# Patient Record
Sex: Female | Born: 1955 | Race: Black or African American | Hispanic: No | Smoking: Current every day smoker
Health system: Southern US, Community
[De-identification: ages and names within clinical notes are randomized; demographics above are authoritative.]

## PROBLEM LIST (undated history)

## (undated) DIAGNOSIS — I10 Essential (primary) hypertension: Secondary | ICD-10-CM

## (undated) DIAGNOSIS — Z780 Asymptomatic menopausal state: Secondary | ICD-10-CM

## (undated) HISTORY — PX: TONSILLECTOMY: SUR1361

---

## 2014-09-06 ENCOUNTER — Ambulatory Visit: Payer: Self-pay | Admitting: Internal Medicine

## 2015-02-19 ENCOUNTER — Emergency Department
Admission: EM | Admit: 2015-02-19 | Discharge: 2015-02-19 | Disposition: A | Discharge status: Home / Self Care | Payer: BLUE CROSS/BLUE SHIELD | Attending: Emergency Medicine | Admitting: Emergency Medicine

## 2015-02-19 ENCOUNTER — Emergency Department: Payer: BLUE CROSS/BLUE SHIELD

## 2015-02-19 DIAGNOSIS — M25552 Pain in left hip: Secondary | ICD-10-CM | POA: Insufficient documentation

## 2015-02-19 DIAGNOSIS — R1032 Left lower quadrant pain: Secondary | ICD-10-CM | POA: Diagnosis present

## 2015-02-19 LAB — URINALYSIS COMPLETE WITH MICROSCOPIC (ARMC ONLY)
BACTERIA UA: NONE SEEN
Bilirubin Urine: NEGATIVE
Glucose, UA: NEGATIVE mg/dL
Ketones, ur: NEGATIVE mg/dL
LEUKOCYTES UA: NEGATIVE
Nitrite: NEGATIVE
PH: 5 (ref 5.0–8.0)
Protein, ur: NEGATIVE mg/dL
Specific Gravity, Urine: 1.027 (ref 1.005–1.030)

## 2015-02-19 MED ORDER — NAPROXEN 500 MG PO TABS
500.0000 mg | ORAL_TABLET | Freq: Two times a day (BID) | ORAL | Status: DC
  Administered 2015-02-19: 500 mg via ORAL

## 2015-02-19 MED ORDER — NAPROXEN 500 MG PO TABS
ORAL_TABLET | ORAL | Status: AC
  Filled 2015-02-19: qty 1

## 2015-02-19 MED ORDER — NAPROXEN 500 MG PO TABS: 500.0000 mg | ORAL_TABLET | Freq: Two times a day (BID) | ORAL | Status: AC

## 2015-02-19 NOTE — ED Notes (Signed)
Patient reports bilateral groin pain since yesterday.  Denies any type of injury.  Patient reports she had rash in same area and unsure if they are related.

## 2015-02-19 NOTE — ED Provider Notes (Signed)
[facility] Emergency Department Provider Note ____________________________________________  Time seen: Approximately 9:47 PM  I have reviewed the triage vital signs and the nursing notes.   HISTORY  Chief Complaint Groin Pain    HPI Jacqueline Rollins is a 59 y.o. female who presents to the emergency department for left groin pain. She states that while standing in the kitchen cooking she had a sharp stabbing pain in the left groin that radiated down into her leg and across her abdomen. She denies the pain now but states that she is afraid that it's going to return.   No past medical history on file.  There are no active problems to display for this patient.   No past surgical history on file.  No current outpatient prescriptions on file.  Allergies Review of patient's allergies indicates no known allergies.  No family history on file.  Social History History  Substance Use Topics  . Smoking status: Not on file  . Smokeless tobacco: Not on file  . Alcohol Use: Not on file    Review of Systems Constitutional: No recent illness. Eyes: No visual changes. ENT: No sore throat. Cardiovascular: Denies chest pain or palpitations. Respiratory: Denies shortness of breath. Gastrointestinal: No abdominal pain.  Genitourinary: Negative for dysuria. Musculoskeletal: Pain in left groin/anterior hip area. Skin: Negative for rash. Neurological: Negative for headaches, focal weakness or numbness. 10-point ROS otherwise negative.  ____________________________________________   PHYSICAL EXAM:  VITAL SIGNS: ED Triage Vitals  Enc Vitals Group     BP 02/19/15 2022 166/81 mmHg     Pulse Rate 02/19/15 2022 70     Resp 02/19/15 2022 18     Temp 02/19/15 2022 98.1 F (36.7 C)     Temp Source 02/19/15 2022 Oral     SpO2 02/19/15 2022 98 %     Weight 02/19/15 2022 180 lb (81.647 kg)     Height 02/19/15 2022 5\' 6"  (1.676 m)     Head Cir --      Peak  Flow --      Pain Score 02/19/15 2024 0     Pain Loc --      Pain Edu? --      Excl. in [place]? --     Constitutional: Alert and oriented. Well appearing and in no acute distress. Eyes: Conjunctivae are normal. EOMI. Head: Atraumatic. Nose: No congestion/rhinnorhea. Neck: No stridor.  Respiratory: Normal respiratory effort. Abdomen: No tenderness with palpation.   Musculoskeletal: Full ROM of hip without pain on internal /external rotation. Weight bearing without assistance. Neurologic:  Normal speech and language. No gross focal neurologic deficits are appreciated. Speech is normal. No gait instability. Skin:  Skin is warm, dry and intact. Atraumatic. Psychiatric: Mood and affect are normal. Speech and behavior are normal.  ____________________________________________   LABS (all labs ordered are listed, but only abnormal results are displayed)  Labs Reviewed  URINALYSIS COMPLETEWITH MICROSCOPIC ([place]) - Abnormal; Notable for the following:    Color, Urine YELLOW (*)    APPearance CLEAR (*)    Hgb urine dipstick 2+ (*)    Squamous Epithelial / LPF 0-5 (*)    All other components within normal limits   ____________________________________________  RADIOLOGY  Hip negative for acute pathology--image viewed by me. ____________________________________________   PROCEDURES  Procedure(s) performed: None   ____________________________________________   INITIAL IMPRESSION / ASSESSMENT AND PLAN / ED COURSE  Pertinent labs & imaging results that were available during my care of the patient were  reviewed by me and considered in my medical decision making (see chart for details).  Patient was advised to follow-up with orthopedics for symptoms that return. She was advised to return to the emergency department for symptoms that change or worsen if she is unable schedule an appointment. ____________________________________________   FINAL CLINICAL IMPRESSION(S) / ED  DIAGNOSES  Final diagnoses:  Left groin pain      Victorino Dike, FNP 02/19/15 2315  Lavonia Drafts, MD 02/20/15 1247

## 2015-04-07 ENCOUNTER — Ambulatory Visit: Payer: BLUE CROSS/BLUE SHIELD | Attending: Internal Medicine

## 2015-04-07 DIAGNOSIS — G4733 Obstructive sleep apnea (adult) (pediatric): Secondary | ICD-10-CM | POA: Insufficient documentation

## 2015-05-10 ENCOUNTER — Ambulatory Visit: Payer: BLUE CROSS/BLUE SHIELD | Attending: Internal Medicine

## 2015-05-10 DIAGNOSIS — G4733 Obstructive sleep apnea (adult) (pediatric): Secondary | ICD-10-CM | POA: Insufficient documentation

## 2015-06-19 ENCOUNTER — Emergency Department: Payer: BLUE CROSS/BLUE SHIELD

## 2015-06-19 ENCOUNTER — Other Ambulatory Visit: Payer: Self-pay

## 2015-06-19 ENCOUNTER — Encounter: Payer: Self-pay | Admitting: Emergency Medicine

## 2015-06-19 ENCOUNTER — Emergency Department
Admission: EM | Admit: 2015-06-19 | Discharge: 2015-06-19 | Disposition: A | Discharge status: Home / Self Care | Payer: BLUE CROSS/BLUE SHIELD | Attending: Emergency Medicine | Admitting: Emergency Medicine

## 2015-06-19 DIAGNOSIS — R42 Dizziness and giddiness: Secondary | ICD-10-CM | POA: Diagnosis not present

## 2015-06-19 DIAGNOSIS — Z79899 Other long term (current) drug therapy: Secondary | ICD-10-CM | POA: Diagnosis not present

## 2015-06-19 DIAGNOSIS — R079 Chest pain, unspecified: Secondary | ICD-10-CM | POA: Diagnosis not present

## 2015-06-19 DIAGNOSIS — F17200 Nicotine dependence, unspecified, uncomplicated: Secondary | ICD-10-CM | POA: Insufficient documentation

## 2015-06-19 DIAGNOSIS — I10 Essential (primary) hypertension: Secondary | ICD-10-CM | POA: Diagnosis not present

## 2015-06-19 DIAGNOSIS — Z791 Long term (current) use of non-steroidal anti-inflammatories (NSAID): Secondary | ICD-10-CM | POA: Insufficient documentation

## 2015-06-19 HISTORY — DX: Asymptomatic menopausal state: Z78.0

## 2015-06-19 HISTORY — DX: Essential (primary) hypertension: I10

## 2015-06-19 LAB — CBC
HEMATOCRIT: 39.8 % (ref 35.0–47.0)
Hemoglobin: 13.4 g/dL (ref 12.0–16.0)
MCH: 31.8 pg (ref 26.0–34.0)
MCHC: 33.7 g/dL (ref 32.0–36.0)
MCV: 94.5 fL (ref 80.0–100.0)
Platelets: 212 10*3/uL (ref 150–440)
RBC: 4.21 MIL/uL (ref 3.80–5.20)
RDW: 15.1 % — AB (ref 11.5–14.5)
WBC: 6 10*3/uL (ref 3.6–11.0)

## 2015-06-19 LAB — COMPREHENSIVE METABOLIC PANEL
ALBUMIN: 4.2 g/dL (ref 3.5–5.0)
ALK PHOS: 95 U/L (ref 38–126)
ALT: 39 U/L (ref 14–54)
ANION GAP: 9 (ref 5–15)
AST: 82 U/L — ABNORMAL HIGH (ref 15–41)
BUN: 16 mg/dL (ref 6–20)
CO2: 25 mmol/L (ref 22–32)
Calcium: 8.7 mg/dL — ABNORMAL LOW (ref 8.9–10.3)
Chloride: 111 mmol/L (ref 101–111)
Creatinine, Ser: 0.94 mg/dL (ref 0.44–1.00)
GFR calc Af Amer: >60 mL/min (ref >60)
GFR calc non Af Amer: >60 mL/min (ref >60)
Glucose, Bld: 123 mg/dL — ABNORMAL HIGH (ref 65–99)
Potassium: 3.5 mmol/L (ref 3.5–5.1)
Sodium: 145 mmol/L (ref 135–145)
TOTAL PROTEIN: 7.1 g/dL (ref 6.5–8.1)
Total Bilirubin: 0.4 mg/dL (ref 0.3–1.2)

## 2015-06-19 LAB — TROPONIN I: Troponin I: <0.03 ng/mL (ref <0.031)

## 2015-06-19 NOTE — ED Provider Notes (Signed)
I was asked by Dr. Dahlia Client to follow-up second troponin and if normal patient is appropriate for discharge. Patient continues to be chest pain-free and agrees with discharge.  Lavonia Drafts, MD 06/19/15 [phone]

## 2015-06-19 NOTE — Discharge Instructions (Signed)
Nonspecific Chest Pain  °Chest pain can be caused by many different conditions. There is always a chance that your pain could be related to something serious, such as a heart attack or a blood clot in your lungs. Chest pain can also be caused by conditions that are not life-threatening. If you have chest pain, it is very important to follow up with your health care provider. °CAUSES  °Chest pain can be caused by: °· Heartburn. °· Pneumonia or bronchitis. °· Anxiety or stress. °· Inflammation around your heart (pericarditis) or lung (pleuritis or pleurisy). °· A blood clot in your lung. °· A collapsed lung (pneumothorax). It can develop suddenly on its own (spontaneous pneumothorax) or from trauma to the chest. °· Shingles infection (varicella-zoster virus). °· Heart attack. °· Damage to the bones, muscles, and cartilage that make up your chest wall. This can include: °¨ Bruised bones due to injury. °¨ Strained muscles or cartilage due to frequent or repeated coughing or overwork. °¨ Fracture to one or more ribs. °¨ Sore cartilage due to inflammation (costochondritis). °RISK FACTORS  °Risk factors for chest pain may include: °· Activities that increase your risk for trauma or injury to your chest. °· Respiratory infections or conditions that cause frequent coughing. °· Medical conditions or overeating that can cause heartburn. °· Heart disease or family history of heart disease. °· Conditions or health behaviors that increase your risk of developing a blood clot. °· Having had chicken pox (varicella zoster). °SIGNS AND SYMPTOMS °Chest pain can feel like: °· Burning or tingling on the surface of your chest or deep in your chest. °· Crushing, pressure, aching, or squeezing pain. °· Dull or sharp pain that is worse when you move, cough, or take a deep breath. °· Pain that is also felt in your back, neck, shoulder, or arm, or pain that spreads to any of these areas. °Your chest pain may come and go, or it may stay  constant. °DIAGNOSIS °Lab tests or other studies may be needed to find the cause of your pain. Your health care provider may have you take a test called an ambulatory ECG (electrocardiogram). An ECG records your heartbeat patterns at the time the test is performed. You may also have other tests, such as: °· Transthoracic echocardiogram (TTE). During echocardiography, sound waves are used to create a picture of all of the heart structures and to look at how blood flows through your heart. °· Transesophageal echocardiogram (TEE). This is a more advanced imaging test that obtains images from inside your body. It allows your health care provider to see your heart in finer detail. °· Cardiac monitoring. This allows your health care provider to monitor your heart rate and rhythm in real time. °· Holter monitor. This is a portable device that records your heartbeat and can help to diagnose abnormal heartbeats. It allows your health care provider to track your heart activity for several days, if needed. °· Stress tests. These can be done through exercise or by taking medicine that makes your heart beat more quickly. °· Blood tests. °· Imaging tests. °TREATMENT  °Your treatment depends on what is causing your chest pain. Treatment may include: °· Medicines. These may include: °¨ Acid blockers for heartburn. °¨ Anti-inflammatory medicine. °¨ Pain medicine for inflammatory conditions. °¨ Antibiotic medicine, if an infection is present. °¨ Medicines to dissolve blood clots. °¨ Medicines to treat coronary artery disease. °· Supportive care for conditions that do not require medicines. This may include: °¨ Resting. °¨ Applying heat   or cold packs to injured areas. °¨ Limiting activities until pain decreases. °HOME CARE INSTRUCTIONS °· If you were prescribed an antibiotic medicine, finish it all even if you start to feel better. °· Avoid any activities that bring on chest pain. °· Do not use any tobacco products, including  cigarettes, chewing tobacco, or electronic cigarettes. If you need help quitting, ask your health care provider. °· Do not drink alcohol. °· Take medicines only as directed by your health care provider. °· Keep all follow-up visits as directed by your health care provider. This is important. This includes any further testing if your chest pain does not go away. °· If heartburn is the cause for your chest pain, you may be told to keep your head raised (elevated) while sleeping. This reduces the chance that acid will go from your stomach into your esophagus. °· Make lifestyle changes as directed by your health care provider. These may include: °¨ Getting regular exercise. Ask your health care provider to suggest some activities that are safe for you. °¨ Eating a heart-healthy diet. A registered dietitian can help you to learn healthy eating options. °¨ Maintaining a healthy weight. °¨ Managing diabetes, if necessary. °¨ Reducing stress. °SEEK MEDICAL CARE IF: °· Your chest pain does not go away after treatment. °· You have a rash with blisters on your chest. °· You have a fever. °SEEK IMMEDIATE MEDICAL CARE IF:  °· Your chest pain is worse. °· You have an increasing cough, or you cough up blood. °· You have severe abdominal pain. °· You have severe weakness. °· You faint. °· You have chills. °· You have sudden, unexplained chest discomfort. °· You have sudden, unexplained discomfort in your arms, back, neck, or jaw. °· You have shortness of breath at any time. °· You suddenly start to sweat, or your skin gets clammy. °· You feel nauseous or you vomit. °· You suddenly feel light-headed or dizzy. °· Your heart begins to beat quickly, or it feels like it is skipping beats. °These symptoms may represent a serious problem that is an emergency. Do not wait to see if the symptoms will go away. Get medical help right away. Call your local emergency services (911 in the [place] Do not drive yourself to the hospital. °  °This  information is not intended to replace advice given to you by your health care provider. Make sure you discuss any questions you have with your health care provider. °  °Document Released: 05/01/2005 Document Revised: 08/12/2014 Document Reviewed: 02/25/2014 °Elsevier Interactive Patient Education ©2016 [employer] ° °

## 2015-06-19 NOTE — ED Notes (Signed)
Pt noted resting comfortably.  

## 2015-06-19 NOTE — ED Provider Notes (Signed)
[facility] Emergency Department Provider Note  ____________________________________________  Time seen: Approximately [id number] AM  I have reviewed the triage vital signs and the nursing notes.   HISTORY  Chief Complaint Chest Pain    HPI Jacqueline Rollins is a 59 y.o. female who comes into the hospital today with chest pain. The patient reports that she was sleeping and somewhat woke her up. The patient reports that she started having chest pain. She reports it felt like chest tightening. The patient thought that she had gas and tried to pass some and the pain eased up but it returned when she laid down. The patient once he is a bathroom again but the pain would not go away she she decided to come in for evaluation. She reports that the pain did radiate to her back but she denies any shortness of breath or sweats nausea or vomiting. The patient did feel some lightheadedness. She reports that this all started at 3 AM. Currently the pain is a 0 out of 10 in intensity. The patient did not take anything for her pain and she has never had this happen to her before. She reports that while she did not vomit she did feel like she had some increased salivation.   Past Medical History  Diagnosis Date  . Hypertension   . Postmenopausal     There are no active problems to display for this patient.   Past Surgical History  Procedure Laterality Date  . Tonsillectomy      Current Outpatient Rx  Name  Route  Sig  Dispense  Refill  . amLODipine (NORVASC) 10 MG tablet   Oral   Take 1 tablet by mouth daily.         . carvedilol (COREG) 3.125 MG tablet   Oral   Take 1 tablet by mouth 2 (two) times daily.         . cloNIDine (CATAPRES) 0.1 MG tablet   Oral   Take 1 tablet by mouth 3 (three) times daily.         Marland Kitchen gabapentin (NEURONTIN) 100 MG capsule   Oral   Take 1 capsule by mouth 3 (three) times daily.         . meloxicam (MOBIC) 7.5 MG tablet   Oral    Take 1 tablet by mouth daily.         Marland Kitchen olmesartan-hydrochlorothiazide (BENICAR HCT) 40-25 MG tablet   Oral   Take 1 tablet by mouth daily.         Marland Kitchen omeprazole (PRILOSEC) 20 MG capsule   Oral   Take 1 capsule by mouth daily.         . naproxen (NAPROSYN) 500 MG tablet   Oral   Take 1 tablet (500 mg total) by mouth 2 (two) times daily with a meal. Patient not taking: Reported on 06/19/2015   60 tablet   2     Allergies Review of patient's allergies indicates no known allergies.  No family history on file.  Social History Social History  Substance Use Topics  . Smoking status: Current Every Day Smoker  . Smokeless tobacco: None  . Alcohol Use: Yes     Comment: Occasional    Review of Systems Constitutional: No fever/chills Eyes: No visual changes. ENT: No sore throat. Cardiovascular:  chest pain. Respiratory: Denies shortness of breath. Gastrointestinal: No abdominal pain.  No nausea, no vomiting.  No diarrhea.  No constipation. Genitourinary: Negative for dysuria. Musculoskeletal: Negative for  back pain. Skin: Negative for rash. Neurological: Lightheadedness  10-point ROS otherwise negative.  ____________________________________________   PHYSICAL EXAM:  VITAL SIGNS: ED Triage Vitals  Enc Vitals Group     BP 06/19/15 0417 147/79 mmHg     Pulse Rate 06/19/15 0417 84     Resp 06/19/15 0417 18     Temp 06/19/15 0417 98.4 F (36.9 C)     Temp Source 06/19/15 0417 Oral     SpO2 06/19/15 0417 97 %     Weight 06/19/15 0417 180 lb (81.647 kg)     Height 06/19/15 0417 5\' 6"  (1.676 m)     Head Cir --      Peak Flow --      Pain Score 06/19/15 0417 5     Pain Loc --      Pain Edu? --      Excl. in [place]? --     Constitutional: Alert and oriented. Well appearing and in no acute distress. Eyes: Conjunctivae are normal. PERRL. EOMI. Head: Atraumatic. Nose: No congestion/rhinnorhea. Mouth/Throat: Mucous membranes are moist.  Oropharynx  non-erythematous. Cardiovascular: Normal rate, regular rhythm. Grossly normal heart sounds.  Good peripheral circulation. Respiratory: Normal respiratory effort.  No retractions. Lungs CTAB. Gastrointestinal: Soft and nontender. No distention. Positive bowel sounds Musculoskeletal: No lower extremity tenderness nor edema.   Neurologic:  Normal speech and language.  Skin:  Skin is warm, dry and intact.  Psychiatric: Mood and affect are normal.   ____________________________________________   LABS (all labs ordered are listed, but only abnormal results are displayed)  Labs Reviewed  CBC - Abnormal; Notable for the following:    RDW 15.1 (*)    All other components within normal limits  COMPREHENSIVE METABOLIC PANEL - Abnormal; Notable for the following:    Glucose, Bld 123 (*)    Calcium 8.7 (*)    AST 82 (*)    All other components within normal limits  TROPONIN I  TROPONIN I   ____________________________________________  EKG  ED ECG REPORT I, Loney Hering, the attending physician, personally viewed and interpreted this ECG.   Date: 06/19/2015  EKG Time: 409  Rate: 73  Rhythm: normal sinus rhythm  Axis: Normal  Intervals:none  ST&T Change: Flipped T waves diffusely in numerous leads.  ____________________________________________  RADIOLOGY  Chest x-ray: Borderline cardiomegaly, no localizing process ____________________________________________   PROCEDURES  Procedure(s) performed: None  Critical Care performed: No  ____________________________________________   INITIAL IMPRESSION / ASSESSMENT AND PLAN / ED COURSE  Pertinent labs & imaging results that were available during my care of the patient were reviewed by me and considered in my medical decision making (see chart for details).  This is a 59 year old female who comes in today with chest pain. The patient's pain was resolved when she arrived to the emergency department. The patient's first  troponin is unremarkable. We'll repeat a troponin and then reassess the patient.  The patient's care will be signed out to Dr. Corky Downs who will follow up the repeat troponin.  ____________________________________________   FINAL CLINICAL IMPRESSION(S) / ED DIAGNOSES  Final diagnoses:  Chest pain, unspecified chest pain type      Loney Hering, MD 06/19/15 [phone]

## 2015-06-19 NOTE — ED Notes (Signed)
Pt says she was wakened with pain to the center of her chest around 3am; radiates through to her back; nausea, no vomiting; denies similar episodes

## 2015-11-21 ENCOUNTER — Telehealth: Payer: Self-pay | Admitting: Gastroenterology

## 2015-11-21 DIAGNOSIS — K219 Gastro-esophageal reflux disease without esophagitis: Secondary | ICD-10-CM | POA: Insufficient documentation

## 2015-11-21 DIAGNOSIS — I10 Essential (primary) hypertension: Secondary | ICD-10-CM | POA: Insufficient documentation

## 2015-11-21 DIAGNOSIS — A6000 Herpesviral infection of urogenital system, unspecified: Secondary | ICD-10-CM | POA: Insufficient documentation

## 2015-11-21 DIAGNOSIS — E079 Disorder of thyroid, unspecified: Secondary | ICD-10-CM | POA: Insufficient documentation

## 2015-11-21 DIAGNOSIS — G4730 Sleep apnea, unspecified: Secondary | ICD-10-CM | POA: Insufficient documentation

## 2015-11-21 NOTE — Telephone Encounter (Signed)
colonoscopy

## 2015-11-21 NOTE — Telephone Encounter (Signed)
LVM for pt to return my call.

## 2015-12-20 NOTE — Telephone Encounter (Signed)
Tried leaving message again, but no vm set up. Mailed letter requesting a call back.

## 2016-02-08 ENCOUNTER — Other Ambulatory Visit: Payer: Self-pay | Admitting: Family Medicine

## 2016-02-08 DIAGNOSIS — Z1231 Encounter for screening mammogram for malignant neoplasm of breast: Secondary | ICD-10-CM

## 2016-04-05 IMAGING — CR DG HIP (WITH OR WITHOUT PELVIS) 2-3V*L*
1 series · 3 of 3 positions shown · non-contrast
Comparison: None.

CLINICAL DATA: Acute onset of left inguinal pain for 2 days.
Initial encounter.

EXAM:
DG HIP (WITH OR WITHOUT PELVIS) 2-3V LEFT

[Series 1: dg hip unilat with pelvis 2-3 views left · 0.14mm/px · 3 of 3 slices shown]
[im 1/3]
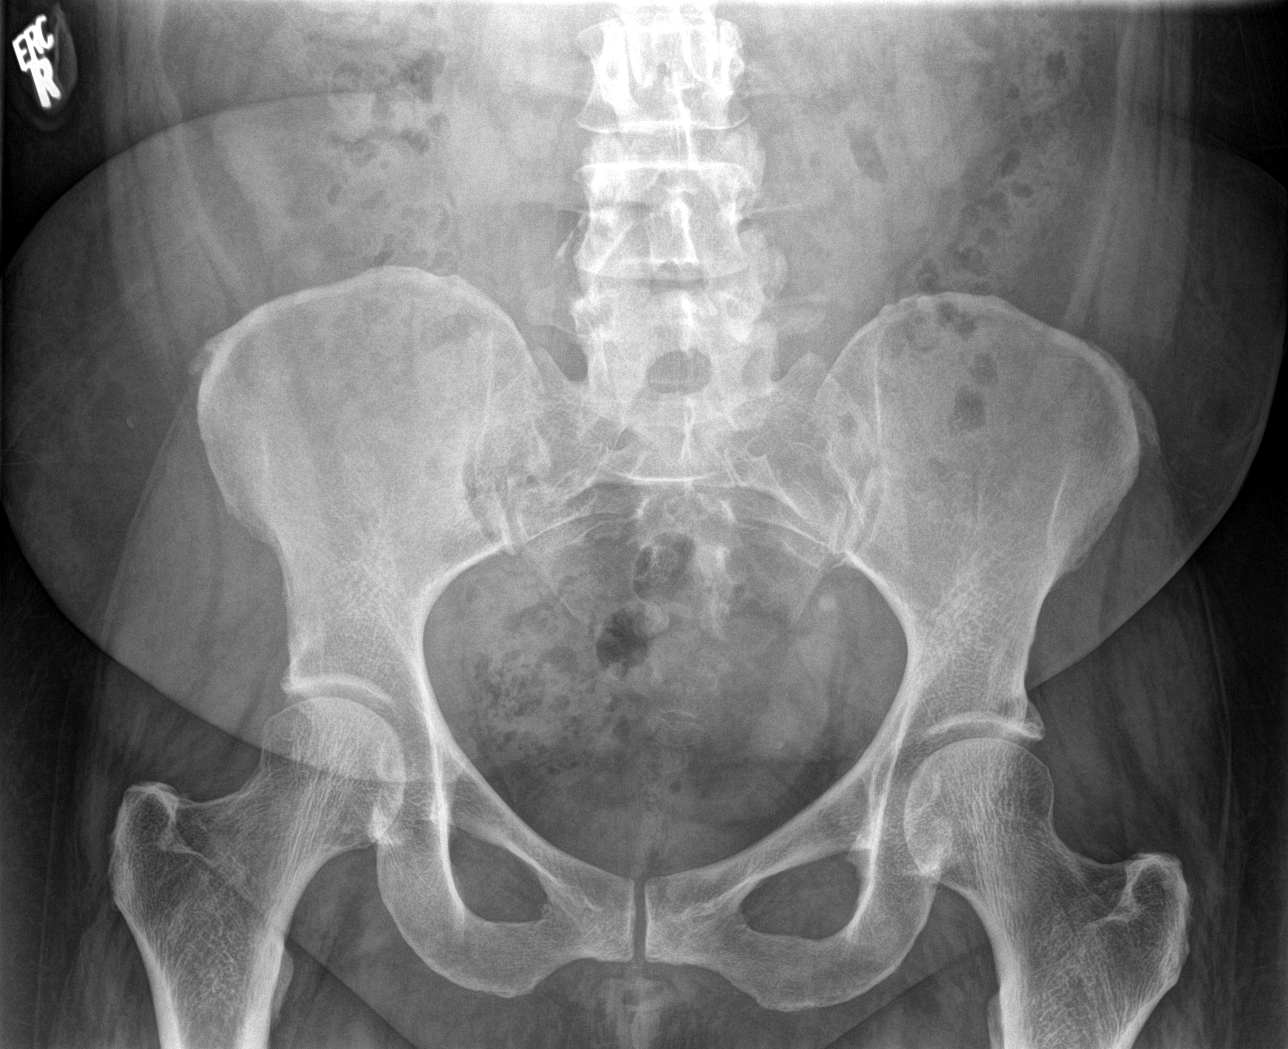
[im 2/3]
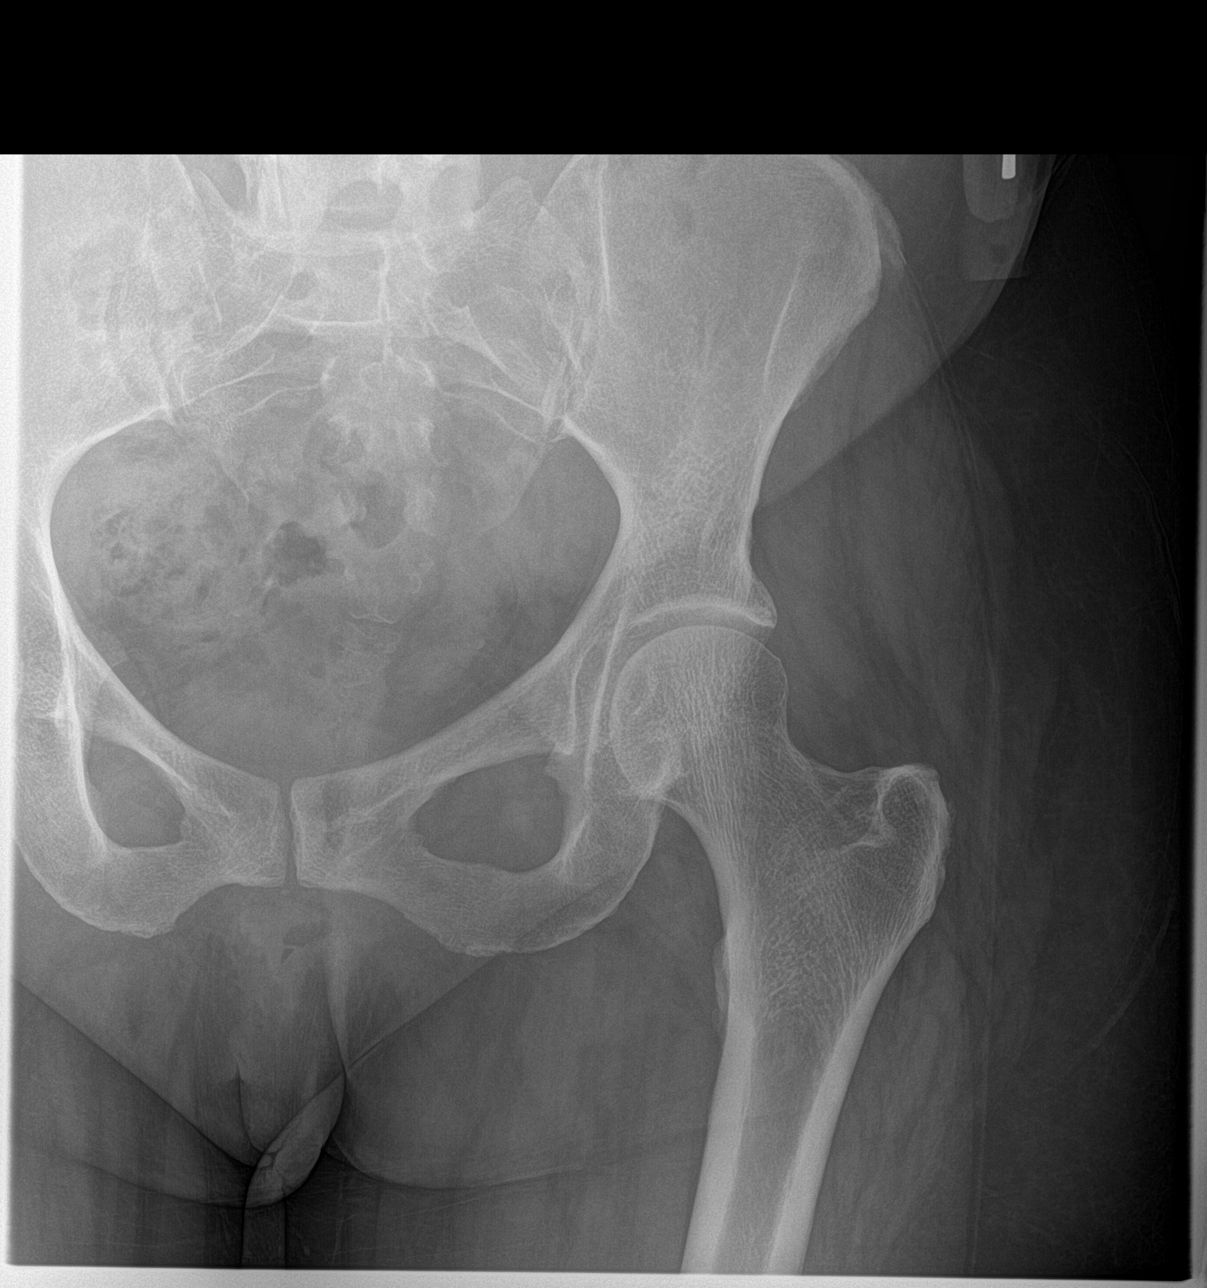
[im 3/3]
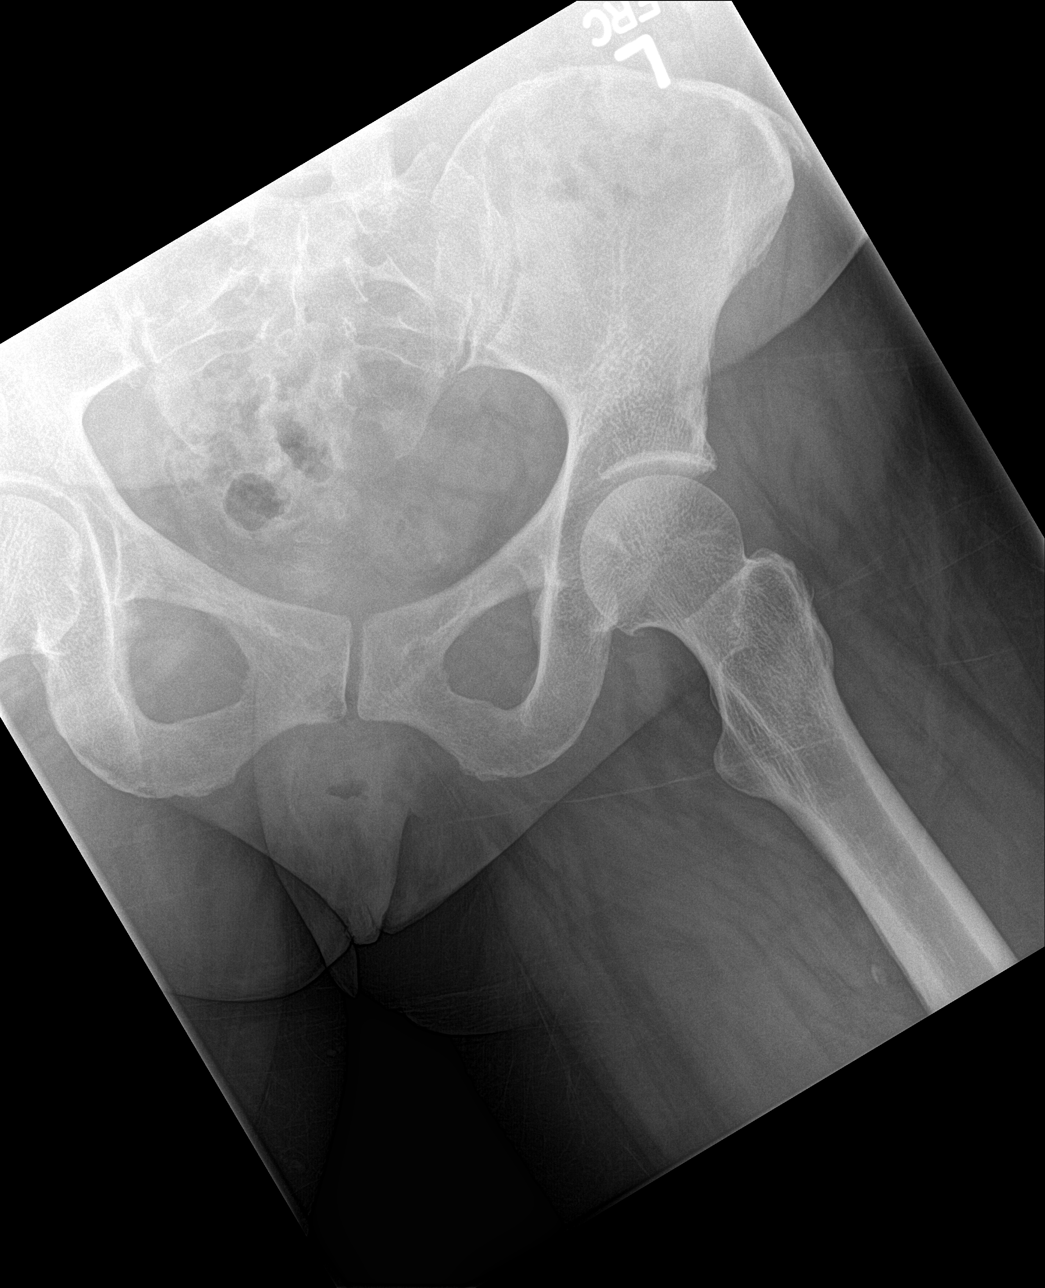

[3 of 3 positions shown; findings below may reference images not displayed]

FINDINGS: There is no evidence of fracture or dislocation. Both femoral heads
are seated normally within their respective acetabula. The proximal
left femur appears intact. No significant degenerative change is
appreciated. The sacroiliac joints are unremarkable in appearance.

The visualized bowel gas pattern is grossly unremarkable in
appearance. Mild vascular calcifications are seen at the aortic
bifurcation.
IMPRESSION: No evidence of fracture or dislocation.

## 2016-08-03 IMAGING — CR DG CHEST 2V
2 series · 2 of 2 positions shown · non-contrast
Comparison: None.

CLINICAL DATA: Awoke from sleep with chest pain.

EXAM:
CHEST  2 VIEW

[chest pa]
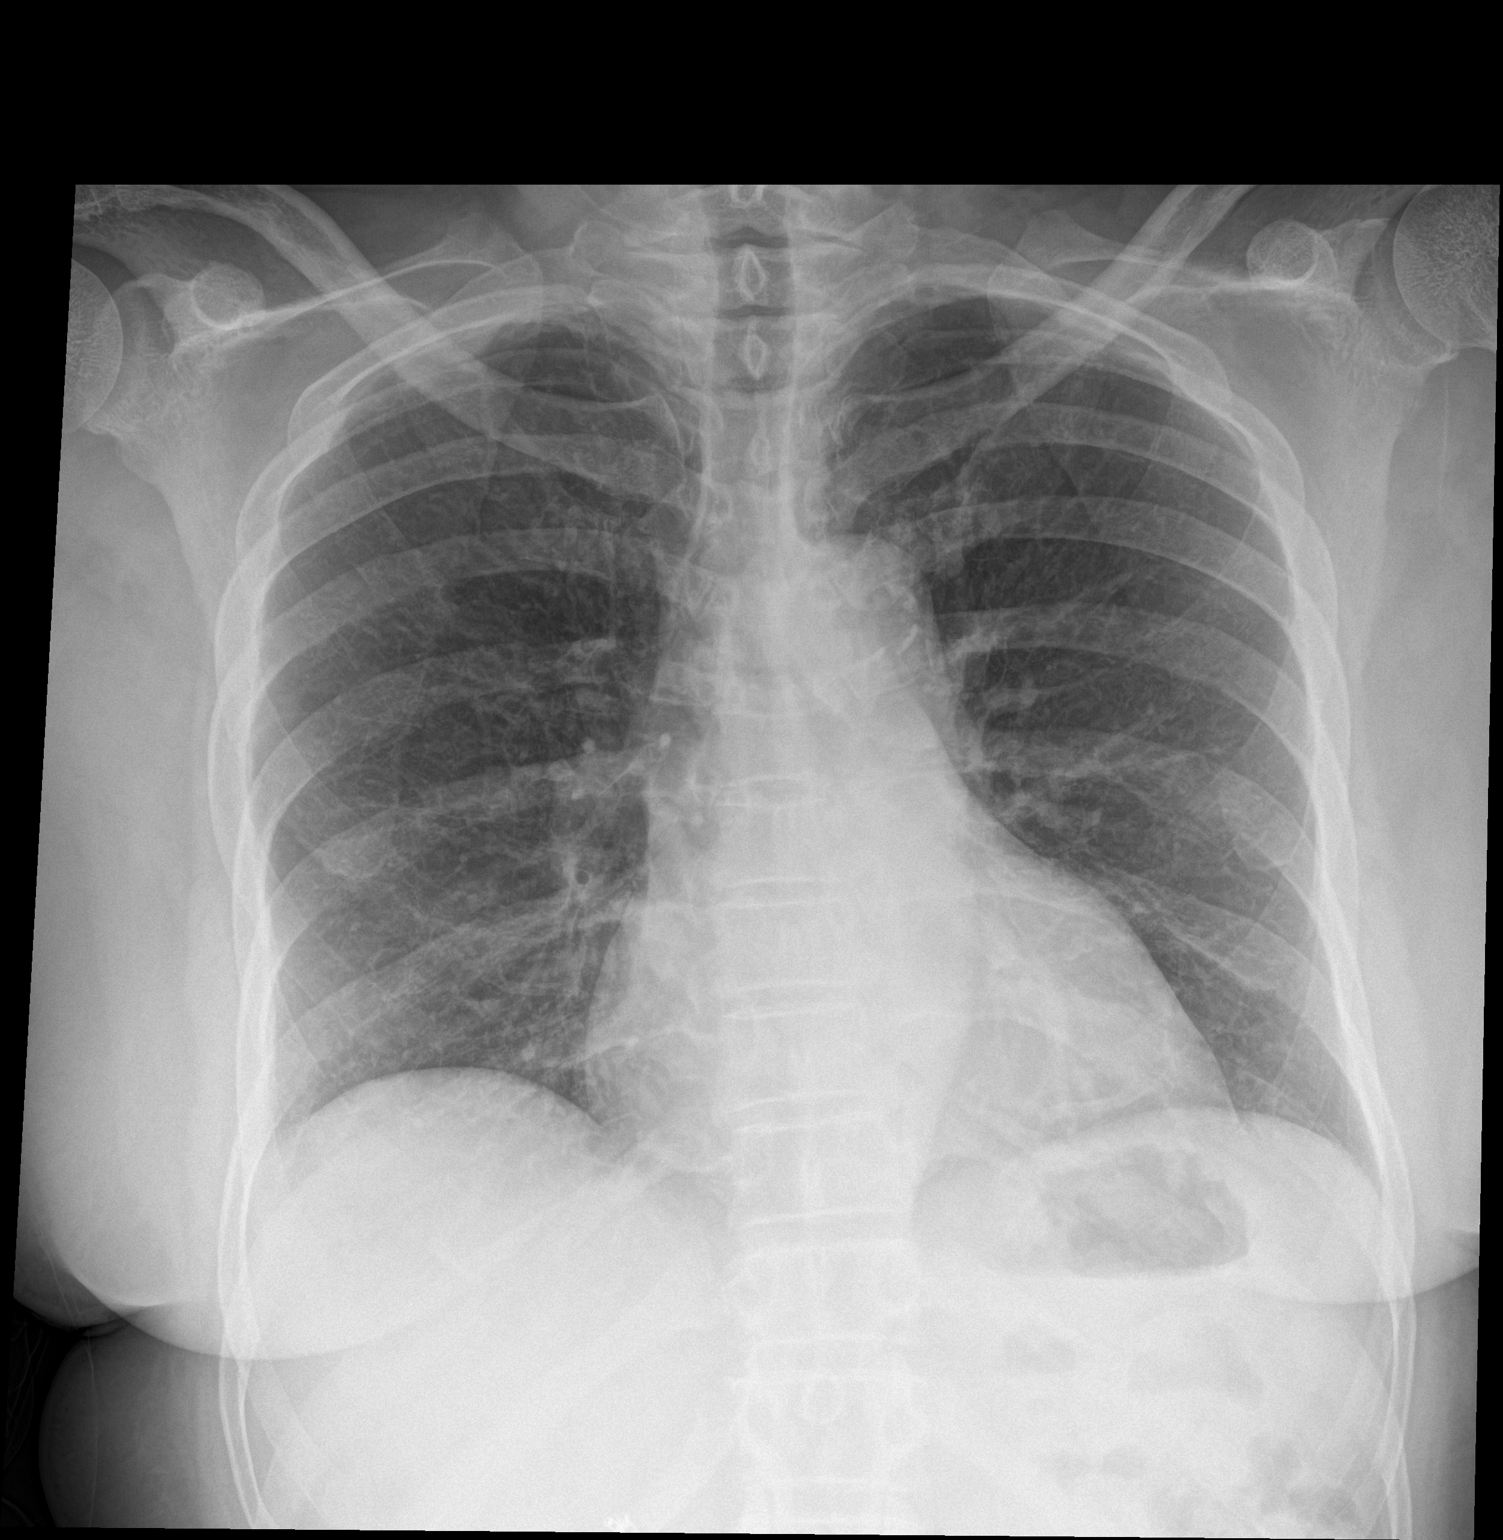

[chest lat]
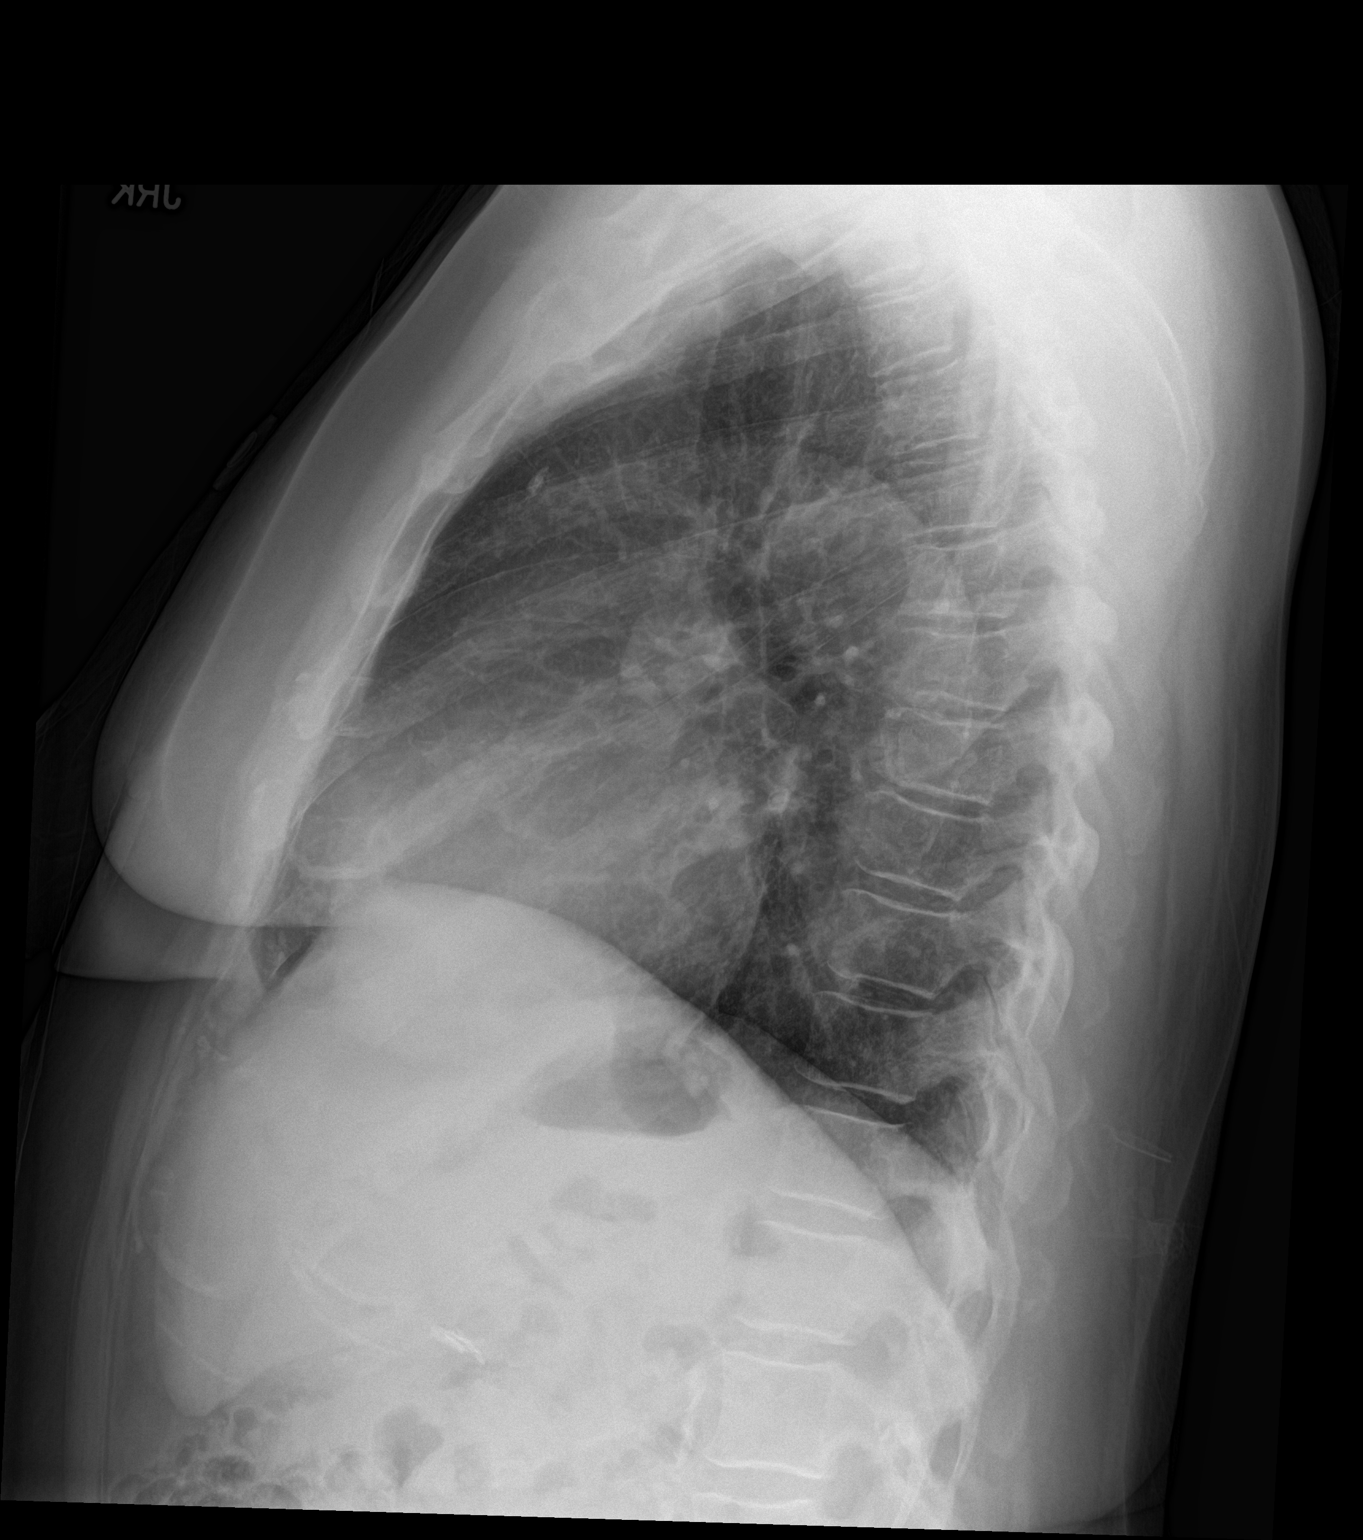

[2 of 2 positions shown; findings below may reference images not displayed]

FINDINGS: Heart is the upper limits of normal in size. Mediastinal contours
are normal. The lungs are clear. Pulmonary vasculature is normal. No
consolidation, pleural effusion, or pneumothorax. No acute osseous
abnormalities are seen.
IMPRESSION: Borderline cardiomegaly.  No localizing process.

## 2017-07-09 ENCOUNTER — Encounter: Payer: Self-pay | Admitting: Physical Therapy

## 2017-07-09 ENCOUNTER — Ambulatory Visit: Payer: Medicare Other | Attending: Family Medicine | Admitting: Physical Therapy

## 2017-07-09 ENCOUNTER — Other Ambulatory Visit: Payer: Self-pay

## 2017-07-09 DIAGNOSIS — M6281 Muscle weakness (generalized): Secondary | ICD-10-CM | POA: Diagnosis present

## 2017-07-09 DIAGNOSIS — M25512 Pain in left shoulder: Secondary | ICD-10-CM | POA: Insufficient documentation

## 2017-07-10 NOTE — Therapy (Signed)
[facility] [address], [place], [address] Phone: [phone]   Fax:  [phone]  Physical Therapy Evaluation  Patient Details  Name: Jacqueline Rollins MRN: [id number] Date of Birth: 1956/03/23 Referring Provider: Gennette Pac FNP   Encounter Date: 07/09/2017  PT End of Session - 07/09/17 1152    Visit Number  1    Number of Visits  12    Date for PT Re-Evaluation  08/20/17    Authorization Type  1    Authorization Time Period  10 (G code)    PT Start Time  1053    PT Stop Time  1145    PT Time Calculation (min)  52 min    Activity Tolerance  Patient tolerated treatment well    Behavior During Therapy  [facility] for tasks assessed/performed       Past Medical History:  Diagnosis Date  . Hypertension   . Postmenopausal     Past Surgical History:  Procedure Laterality Date  . TONSILLECTOMY      There were no vitals filed for this visit.   Subjective Assessment - 07/09/17 1111    Subjective  Improving since it began and currently is having sharp pain with movement, no pain at rest. She cannot lie on her sides because of pain.     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 She reports most recent episode of pain left shoulder began about 3 months ago while playing with children.     Limitations  House hold activities;Other (comment);Lifting taking care of children    Patient Stated Goals  to decrease pain and improve function with shoulder    Currently in Pain?  No/denies at rest 0/10 up to 6/10 with aggravating activities         [facility] PT Assessment - 07/09/17 1106      Assessment   Medical Diagnosis  Pain in left shoulder (M25.512)    Referring Provider  Gennette Pac FNP    Onset Date/Surgical Date  04/05/17    Hand Dominance  Right    Prior Therapy  no      Precautions   Precautions  None       Balance Screen   Has the patient fallen in the past 6 months  No      [place] residence    Living Arrangements  Spouse/significant other;Children    Type of [place] to enter    Entrance Stairs-Number of Steps  4    Entrance Stairs-Rails  Right;Left    Home Layout  Two level    Alternate Level Stairs-Number of Steps  20    Alternate Level Stairs-Rails  Right      Prior Function   Level of Independence  Independent    Vocation  Retired    Information systems manager, 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      Cognition   Overall Cognitive Status  Within Functional Limits for tasks assessed      Observation/Other Assessments   Quick DASH   36%      Sensation   Light Touch  Appears Intact      Posture/Postural Control   Posture Comments  shoulders forward      ROM / Strength   AROM / PROM / Strength  AROM;Strength      AROM   Overall AROM Comments  UE's bilateral shoulders WFL all planes of motion with increased discomfort reported with full forward elevation left shoulder and ER       Strength   Overall Strength Comments  bilateral shoulder flexion 5/5, abduction 5/5, ER and IR strong and non painful, elbow flexion/extension 5/5  bilaterally       Palpation   Palpation comment  left shoulder with point tenderness anterior aspect over biceps tendon, + spasms along upper trapezius left >right and along periscapular muscles and thoracic spine      Neer Impingement test    Findings  Negative    Side  -- bilateral      Hawkins-Kennedy test   Findings  Negative    Side  -- bilateral      Speed's test   Findings  Positive    Side  Left             Objective measurements completed on examination: See above findings.              PT Education - 07/09/17 1110    Education provided  Yes    Education Details  POC, positioning for shoulder to avoid pain, scapular control retraction exercises     Person(s)  Educated  Patient    Methods  Explanation;Demonstration;Verbal cues    Comprehension  Verbalized understanding;Returned demonstration;Verbal cues required          PT Long Term Goals - 07/09/17 1836      PT LONG TERM GOAL #1   Title   Patient will demonstrate improved function and decreased pain in shoulder as indicated by QuickDash score of <20%    Baseline  QuickDash 36%    Status  New    Target Date  08/20/17      PT LONG TERM GOAL #2   Title  Patient will be independent with home program for pain control, exercises for flexibility and strength by to allow transition to self management once discharged from physical therapy    Baseline  limited knowledge of pain control strategies, progression of exercises and requires moderate cuing , guidance    Status  New    Target Date  08/20/17             Plan - 07/10/17 1825    Clinical Impression Statement  Patient is a 61 year old right hand dominant female who presents with pain in left shoulder with Quick dash score of 36% indicating moderate self perceived disability. She has limitations with activities involving raising her arm above shoulder level and with putting arm behind back. She has positive speed's test for biceps involvment as well. She has limited knowledge of appropriate pain control stratgies and exercises/progression and will benefit from physical therapy intervention to address limitations in order to improve function.     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 She reports most recent episode of pain left shoulder began about 3 months ago while playing with children.     Clinical Presentation  Evolving    Clinical Presentation due to:  recent episode of left shoulder pain     Clinical Decision Making  Low    Rehab Potential  Good    Clinical Impairments Affecting Rehab Potential   (+)motivated, acute condition, prior level of function (-)  chronic shoulder pain    PT Frequency  -- 1-2x/week    PT Duration  6 weeks    PT Treatment/Interventions  Iontophoresis 4mg /ml Dexamethasone;Electrical Stimulation;Cryotherapy;Moist Heat;Ultrasound;Therapeutic exercise;Neuromuscular re-education;Patient/family education;Manual techniques;Dry needling    PT Next Visit Plan  pain control, neuromuscular re education, manual therapy techniques, therapeutic exercise    PT Home Exercise Plan  posiitoning left UE to decreased strain, scapular retraction    Consulted and Agree with Plan of Care  Patient       Patient will benefit from skilled therapeutic intervention in order to improve the following deficits and impairments:  Pain, Increased muscle spasms, Postural dysfunction, Decreased activity tolerance, Decreased strength, Impaired perceived functional ability, Impaired UE functional use  Visit Diagnosis: Left shoulder pain, unspecified chronicity - Plan: PT plan of care cert/re-cert  Muscle weakness (generalized) - Plan: PT plan of care cert/re-cert  G-Codes - 97/98/92 1834    Functional Assessment Tool Used (Outpatient Only)  QuickDash, pain, strength, clinical judgment    Functional Limitation  Carrying, moving and handling objects    Carrying, Moving and Handling Objects Current Status (J1941)  At least 20 percent but less than 40 percent impaired, limited or restricted    Carrying, Moving and Handling Objects Goal Status (D4081)  At least 1 percent but less than 20 percent impaired, limited or restricted        Problem List Patient Active Problem List   Diagnosis Date Noted  . Genital herpes 11/21/2015  . Acid reflux 11/21/2015  . BP (high blood pressure) 11/21/2015  . Apnea, sleep 11/21/2015  . Disease of thyroid gland 11/21/2015    Jomarie Longs PT 07/10/2017, 6:49 PM  [facility] [address] [place], [address] Phone: [phone]   Fax:  [phone]  Name: Jacqueline Rollins MRN: [id number] Date of Birth: [birth year]

## 2017-07-14 ENCOUNTER — Encounter: Payer: Medicare Other | Admitting: Physical Therapy

## 2017-07-15 ENCOUNTER — Encounter: Payer: BLUE CROSS/BLUE SHIELD | Admitting: Physical Therapy

## 2017-07-17 ENCOUNTER — Ambulatory Visit: Payer: Medicare Other | Admitting: Physical Therapy

## 2017-07-22 ENCOUNTER — Ambulatory Visit: Payer: Medicare Other | Admitting: Physical Therapy

## 2017-07-24 ENCOUNTER — Ambulatory Visit: Payer: Medicare Other | Admitting: Physical Therapy

## 2017-07-30 ENCOUNTER — Ambulatory Visit: Payer: Medicare Other | Admitting: Physical Therapy

## 2017-08-06 ENCOUNTER — Ambulatory Visit: Payer: Medicare Other | Attending: Family Medicine | Admitting: Physical Therapy

## 2017-08-11 ENCOUNTER — Encounter: Payer: BLUE CROSS/BLUE SHIELD | Admitting: Physical Therapy

## 2017-08-13 ENCOUNTER — Ambulatory Visit: Payer: Medicare Other | Admitting: Physical Therapy

## 2017-08-18 ENCOUNTER — Ambulatory Visit: Payer: Medicare Other | Admitting: Physical Therapy

## 2017-08-20 ENCOUNTER — Ambulatory Visit: Payer: Medicare Other | Admitting: Physical Therapy

## 2017-08-25 ENCOUNTER — Encounter: Payer: BLUE CROSS/BLUE SHIELD | Admitting: Physical Therapy

## 2017-08-27 ENCOUNTER — Encounter: Payer: BLUE CROSS/BLUE SHIELD | Admitting: Physical Therapy

## 2017-09-01 ENCOUNTER — Encounter: Payer: BLUE CROSS/BLUE SHIELD | Admitting: Physical Therapy

## 2017-09-03 ENCOUNTER — Encounter: Payer: BLUE CROSS/BLUE SHIELD | Admitting: Physical Therapy

## 2020-09-21 DIAGNOSIS — K648 Other hemorrhoids: Secondary | ICD-10-CM | POA: Diagnosis not present

## 2020-09-21 DIAGNOSIS — K621 Rectal polyp: Secondary | ICD-10-CM | POA: Diagnosis not present

## 2020-09-21 DIAGNOSIS — K5730 Diverticulosis of large intestine without perforation or abscess without bleeding: Secondary | ICD-10-CM | POA: Diagnosis not present

## 2020-09-21 DIAGNOSIS — Z1211 Encounter for screening for malignant neoplasm of colon: Secondary | ICD-10-CM | POA: Diagnosis not present

## 2020-09-21 DIAGNOSIS — D123 Benign neoplasm of transverse colon: Secondary | ICD-10-CM | POA: Diagnosis not present

## 2020-09-21 DIAGNOSIS — D128 Benign neoplasm of rectum: Secondary | ICD-10-CM | POA: Diagnosis not present

## 2020-09-21 DIAGNOSIS — K635 Polyp of colon: Secondary | ICD-10-CM | POA: Diagnosis not present

## 2020-10-12 DIAGNOSIS — I249 Acute ischemic heart disease, unspecified: Secondary | ICD-10-CM | POA: Diagnosis not present

## 2020-10-12 DIAGNOSIS — I509 Heart failure, unspecified: Secondary | ICD-10-CM | POA: Diagnosis not present

## 2020-10-12 DIAGNOSIS — E0590 Thyrotoxicosis, unspecified without thyrotoxic crisis or storm: Secondary | ICD-10-CM | POA: Diagnosis not present

## 2020-10-12 DIAGNOSIS — I213 ST elevation (STEMI) myocardial infarction of unspecified site: Secondary | ICD-10-CM | POA: Diagnosis not present

## 2020-10-12 DIAGNOSIS — R0789 Other chest pain: Secondary | ICD-10-CM | POA: Diagnosis not present

## 2020-10-12 DIAGNOSIS — I082 Rheumatic disorders of both aortic and tricuspid valves: Secondary | ICD-10-CM | POA: Diagnosis not present

## 2020-10-12 DIAGNOSIS — R9431 Abnormal electrocardiogram [ECG] [EKG]: Secondary | ICD-10-CM | POA: Diagnosis not present

## 2020-10-12 DIAGNOSIS — Z79899 Other long term (current) drug therapy: Secondary | ICD-10-CM | POA: Diagnosis not present

## 2020-10-12 DIAGNOSIS — K219 Gastro-esophageal reflux disease without esophagitis: Secondary | ICD-10-CM | POA: Diagnosis not present

## 2020-10-12 DIAGNOSIS — I998 Other disorder of circulatory system: Secondary | ICD-10-CM | POA: Diagnosis not present

## 2020-10-12 DIAGNOSIS — F17210 Nicotine dependence, cigarettes, uncomplicated: Secondary | ICD-10-CM | POA: Diagnosis not present

## 2020-10-12 DIAGNOSIS — R079 Chest pain, unspecified: Secondary | ICD-10-CM | POA: Diagnosis not present

## 2020-10-12 DIAGNOSIS — I110 Hypertensive heart disease with heart failure: Secondary | ICD-10-CM | POA: Diagnosis not present

## 2020-10-12 DIAGNOSIS — I422 Other hypertrophic cardiomyopathy: Secondary | ICD-10-CM | POA: Diagnosis not present

## 2020-10-12 DIAGNOSIS — I10 Essential (primary) hypertension: Secondary | ICD-10-CM | POA: Diagnosis not present

## 2020-10-12 DIAGNOSIS — I499 Cardiac arrhythmia, unspecified: Secondary | ICD-10-CM | POA: Diagnosis not present

## 2020-10-13 DIAGNOSIS — E0590 Thyrotoxicosis, unspecified without thyrotoxic crisis or storm: Secondary | ICD-10-CM | POA: Diagnosis not present

## 2020-10-13 DIAGNOSIS — R001 Bradycardia, unspecified: Secondary | ICD-10-CM | POA: Diagnosis not present

## 2020-10-13 DIAGNOSIS — I10 Essential (primary) hypertension: Secondary | ICD-10-CM | POA: Diagnosis not present

## 2020-10-13 DIAGNOSIS — E7849 Other hyperlipidemia: Secondary | ICD-10-CM | POA: Diagnosis not present

## 2020-10-13 DIAGNOSIS — R079 Chest pain, unspecified: Secondary | ICD-10-CM | POA: Diagnosis not present

## 2020-10-13 DIAGNOSIS — K219 Gastro-esophageal reflux disease without esophagitis: Secondary | ICD-10-CM | POA: Diagnosis not present

## 2020-10-13 DIAGNOSIS — R0789 Other chest pain: Secondary | ICD-10-CM | POA: Diagnosis not present

## 2020-10-13 DIAGNOSIS — F17210 Nicotine dependence, cigarettes, uncomplicated: Secondary | ICD-10-CM | POA: Diagnosis not present

## 2020-10-13 DIAGNOSIS — I517 Cardiomegaly: Secondary | ICD-10-CM | POA: Diagnosis not present

## 2020-11-28 DIAGNOSIS — I10 Essential (primary) hypertension: Secondary | ICD-10-CM | POA: Diagnosis not present

## 2020-11-28 DIAGNOSIS — B009 Herpesviral infection, unspecified: Secondary | ICD-10-CM | POA: Diagnosis not present

## 2020-11-28 DIAGNOSIS — E0590 Thyrotoxicosis, unspecified without thyrotoxic crisis or storm: Secondary | ICD-10-CM | POA: Diagnosis not present

## 2020-11-28 DIAGNOSIS — E785 Hyperlipidemia, unspecified: Secondary | ICD-10-CM | POA: Diagnosis not present

## 2020-11-28 DIAGNOSIS — I422 Other hypertrophic cardiomyopathy: Secondary | ICD-10-CM | POA: Diagnosis not present
